# Patient Record
Sex: Female | Born: 1990 | Race: White | Hispanic: No | Marital: Single | Smoking: Never smoker
Health system: Southern US, Community
[De-identification: ages and names within clinical notes are randomized; demographics above are authoritative.]

## PROBLEM LIST (undated history)

## (undated) DIAGNOSIS — R630 Anorexia: Secondary | ICD-10-CM

## (undated) DIAGNOSIS — F429 Obsessive-compulsive disorder, unspecified: Secondary | ICD-10-CM

## (undated) HISTORY — DX: Anorexia: R63.0

## (undated) HISTORY — DX: Obsessive-compulsive disorder, unspecified: F42.9

---
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
## 2009-05-10 ENCOUNTER — Ambulatory Visit: Payer: Self-pay | Admitting: Women's Health

## 2010-03-26 ENCOUNTER — Ambulatory Visit: Payer: Self-pay | Admitting: Women's Health

## 2010-05-14 ENCOUNTER — Ambulatory Visit: Payer: Self-pay | Admitting: Women's Health

## 2011-05-16 ENCOUNTER — Encounter: Payer: Self-pay | Admitting: Women's Health

## 2011-07-01 ENCOUNTER — Encounter (INDEPENDENT_AMBULATORY_CARE_PROVIDER_SITE_OTHER): Payer: 59 | Admitting: Women's Health

## 2011-07-01 ENCOUNTER — Other Ambulatory Visit (HOSPITAL_COMMUNITY)
Admission: RE | Admit: 2011-07-01 | Discharge: 2011-07-01 | Disposition: A | Discharge status: Home / Self Care | Payer: 59 | Source: Ambulatory Visit | Attending: Obstetrics and Gynecology | Admitting: Obstetrics and Gynecology

## 2011-07-01 ENCOUNTER — Other Ambulatory Visit: Payer: Self-pay | Admitting: Women's Health

## 2011-07-01 DIAGNOSIS — Z124 Encounter for screening for malignant neoplasm of cervix: Secondary | ICD-10-CM | POA: Insufficient documentation

## 2011-07-01 DIAGNOSIS — Z01419 Encounter for gynecological examination (general) (routine) without abnormal findings: Secondary | ICD-10-CM

## 2011-07-31 ENCOUNTER — Telehealth: Payer: Self-pay
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
## 2011-08-01 ENCOUNTER — Other Ambulatory Visit: Payer: Self-pay | Admitting: Women's Health

## 2011-08-20 MED ORDER — NORETHIN ACE-ETH ESTRAD-FE 1-20 MG-MCG PO TABS: ORAL_TABLET | ORAL | Status: DC

## 2011-08-20 NOTE — Telephone Encounter (Signed)
Addended by: Richardson Chiquito on: 08/20/2011 12:45 PM   Modules accepted: Orders

## 2011-10-02 LAB — RAPID STREP SCREEN (MED CTR MEBANE ONLY)
Streptococcus, Group A Screen (Direct): NEGATIVE
Streptococcus, Group A Screen (Direct): POSITIVE — AB

## 2011-10-02 LAB — DIFFERENTIAL
Basophils Absolute: 0
Eosinophils Relative: 1
Lymphocytes Relative: 8 — ABNORMAL LOW
Lymphs Abs: 1.3
Monocytes Absolute: 0.7
Monocytes Relative: 4
Neutro Abs: 14.9 — ABNORMAL HIGH

## 2011-10-02 LAB — URINALYSIS, ROUTINE W REFLEX MICROSCOPIC
Hgb urine dipstick: NEGATIVE
Nitrite: NEGATIVE
Protein, ur: NEGATIVE
Specific Gravity, Urine: 1.023
Urobilinogen, UA: 1

## 2011-10-02 LAB — CBC
HCT: 39.3
MCV: 91.3
Platelets: 325
RDW: 12
WBC: 17 — ABNORMAL HIGH

## 2011-10-02 LAB — COMPREHENSIVE METABOLIC PANEL
AST: 74 — ABNORMAL HIGH
Albumin: 4.2
BUN: 8
Chloride: 106
Creatinine, Ser: 0.53
GFR calc Af Amer: >60
Total Protein: 8

## 2011-11-12 ENCOUNTER — Other Ambulatory Visit: Payer: Self-pay | Admitting: Family Medicine

## 2011-11-15 ENCOUNTER — Ambulatory Visit
Admission: RE | Admit: 2011-11-15 | Discharge: 2011-11-15 | Disposition: A | Discharge status: Home / Self Care | Payer: 59 | Source: Ambulatory Visit | Attending: Family Medicine | Admitting: Family Medicine

## 2011-12-31 HISTORY — PX: BREAST SURGERY: SHX581

## 2012-03-13 ENCOUNTER — Encounter: Payer: Self-pay | Admitting: Obstetrics and Gynecology

## 2012-03-13 ENCOUNTER — Ambulatory Visit (INDEPENDENT_AMBULATORY_CARE_PROVIDER_SITE_OTHER): Payer: Commercial Managed Care - PPO | Admitting: Obstetrics and Gynecology

## 2012-03-13 ENCOUNTER — Other Ambulatory Visit: Payer: Self-pay | Admitting: Obstetrics and Gynecology

## 2012-03-13 DIAGNOSIS — N390 Urinary tract infection, site not specified: Secondary | ICD-10-CM

## 2012-03-13 DIAGNOSIS — N898 Other specified noninflammatory disorders of vagina: Secondary | ICD-10-CM

## 2012-03-13 DIAGNOSIS — L293 Anogenital pruritus, unspecified: Secondary | ICD-10-CM

## 2012-03-13 DIAGNOSIS — Z113 Encounter for screening for infections with a predominantly sexual mode of transmission: Secondary | ICD-10-CM

## 2012-03-13 LAB — URINALYSIS W MICROSCOPIC + REFLEX CULTURE
Casts: NONE SEEN
Ketones, ur: NEGATIVE mg/dL
Nitrite: NEGATIVE
Protein, ur: NEGATIVE mg/dL
pH: 5.5 (ref 5.0–8.0)

## 2012-03-13 LAB — WET PREP FOR TRICH, YEAST, CLUE
Clue Cells Wet Prep HPF POC: NONE SEEN
Trich, Wet Prep: NONE SEEN

## 2012-03-13 LAB — HEPATITIS B SURFACE ANTIGEN: Hepatitis B Surface Ag: NEGATIVE

## 2012-03-13 MED ORDER — TERCONAZOLE 0.8 % VA CREA: 1.0000 | TOPICAL_CREAM | Freq: Every day | VAGINAL | Status: AC

## 2012-03-13 NOTE — Progress Notes (Signed)
The patient came in today because of vulvar and vaginal irritation and  discharge. She had been on amoxicillin for sinusitis. She then went on a cruise and had intercourse and thought  she was developing a UTI so is now on her fourth day of Keflex. She took one day of Diflucan but did not feel she got any relief from it. She had her last menstrual period at the end of February and had intercourse on March 11. She isn't sure whether a condom was used. She has taken Plan B. She also wanted to be checked for std.  Exam: Kennon Portela present.  Ext: significant vulvitis. Vaginal exam:yeast like discharge. Wet Prep negative. Cervix clean. GC and chlamydial cultures obtained. Uterus: Normal size and shape. Adnexa: Within normal limits.  Assessment: Yeast vaginitis. UTI now on Keflex.  Plan: Blood done for other STDs. Patient will let  me know if she's late for her period. Recheck HIV in June. Terconazole 3 cream. Urine culture obtained.

## 2012-03-14 LAB — GC/CHLAMYDIA PROBE AMP, GENITAL
Chlamydia, DNA Probe: NEGATIVE
GC Probe Amp, Genital: NEGATIVE

## 2012-03-14 LAB — RPR

## 2012-03-15 LAB — URINE CULTURE: Colony Count: NO GROWTH

## 2012-07-20 ENCOUNTER — Ambulatory Visit (INDEPENDENT_AMBULATORY_CARE_PROVIDER_SITE_OTHER): Payer: Commercial Managed Care - PPO | Admitting: Women's Health

## 2012-07-20 ENCOUNTER — Encounter: Payer: Self-pay | Admitting: Women's Health

## 2012-07-20 VITALS — BP 110/70 | Ht 62.75 in | Wt 139.5 lb

## 2012-07-20 DIAGNOSIS — Z8659 Personal history of other mental and behavioral disorders: Secondary | ICD-10-CM

## 2012-07-20 DIAGNOSIS — Z01419 Encounter for gynecological examination (general) (routine) without abnormal findings: Secondary | ICD-10-CM

## 2012-07-20 DIAGNOSIS — Z113 Encounter for screening for infections with a predominantly sexual mode of transmission: Secondary | ICD-10-CM

## 2012-07-20 DIAGNOSIS — Z833 Family history of diabetes mellitus: Secondary | ICD-10-CM

## 2012-07-20 LAB — CBC WITH DIFFERENTIAL/PLATELET
Basophils Absolute: 0 10*3/uL (ref 0.0–0.1)
Eosinophils Relative: 2 % (ref 0–5)
HCT: 38.9 % (ref 36.0–46.0)
Hemoglobin: 13.3 g/dL (ref 12.0–15.0)
Lymphocytes Relative: 34 % (ref 12–46)
Lymphs Abs: 2.1 10*3/uL (ref 0.7–4.0)
MCH: 30.4 pg (ref 26.0–34.0)
MCHC: 34.2 g/dL (ref 30.0–36.0)
Neutro Abs: 3.4 10*3/uL (ref 1.7–7.7)
Platelets: 300 10*3/uL (ref 150–400)

## 2012-07-20 NOTE — Progress Notes (Signed)
Jereline Ticer May 16, 1991 [id number]    History:    The patient presents for annual exam.  Monthly 3-4 days cycle/condoms. Normal Pap last year. Completed gardasil series. History of anorexia/OCD declines need for counseling or medication at this time. Had saline breast implants.  Past medical history, past surgical history, family history and social history were all reviewed and documented in the EPIC chart. Graduated from [employer] - 3.9 average, starting med school at [employer]. Father type I diabetic.   ROS:  A  ROS was performed and pertinent positives and negatives are included in the history.  Exam:  Filed Vitals:   07/20/12 1403  BP: 110/70    General appearance:  Normal Head/Neck:  Normal, without cervical or supraclavicular adenopathy. Thyroid:  Symmetrical, normal in size, without palpable masses or nodularity. Respiratory  Effort:  Normal  Auscultation:  Clear without wheezing or rhonchi Cardiovascular  Auscultation:  Regular rate, without rubs, murmurs or gallops  Edema/varicosities:  Not grossly evident Abdominal  Soft,nontender, without masses, guarding or rebound.  Liver/spleen:  No organomegaly noted  Hernia:  None appreciated  Skin  Inspection:  Grossly normal  Palpation:  Grossly normal Neurologic/psychiatric  Orientation:  Normal with appropriate conversation.  Mood/affect:  Normal  Genitourinary    Breasts: Examined lying and sitting/saline implants.     Right: Without masses, retractions, discharge or axillary adenopathy.     Left: Without masses, retractions, discharge or axillary adenopathy.   Inguinal/mons:  Normal without inguinal adenopathy  External genitalia:  Normal  BUS/Urethra/Skene's glands:  Normal  Bladder:  Normal  Vagina:  Normal  Cervix:  Normal  Uterus:   normal in size, shape and contour.  Midline and mobile  Adnexa/parametria:     Rt: Without masses or tenderness.   Lt: Without masses or tenderness.  Anus and  perineum: Normal     Assessment/Plan:  21 y.o. SWF G0 for annual exam with no complaints.  Normal GYN exam STD screen History of anorexia/OCD  Plan: Contraception management/options reviewed declines will continue with condoms. SBE's, exercise, calcium rich diet, campus safety reviewed. CBC, glucose, UA, GC/Chlamydia, HIV, hepatitis B/C., RPR.    Harrington Challenger WHNP, 2:35 PM 07/20/2012

## 2012-07-20 NOTE — Patient Instructions (Addendum)

## 2012-07-21 LAB — URINALYSIS W MICROSCOPIC + REFLEX CULTURE
Bilirubin Urine: NEGATIVE
Crystals: NONE SEEN
Leukocytes, UA: NEGATIVE
Nitrite: NEGATIVE
Protein, ur: NEGATIVE mg/dL
Specific Gravity, Urine: 1.016 (ref 1.005–1.030)
Squamous Epithelial / LPF: NONE SEEN
Urobilinogen, UA: 0.2 mg/dL (ref 0.0–1.0)

## 2012-07-21 LAB — HEPATITIS C ANTIBODY: HCV Ab: NEGATIVE

## 2012-07-21 LAB — GLUCOSE, RANDOM: Glucose, Bld: 79 mg/dL (ref 70–99)

## 2012-07-21 LAB — HEPATITIS B SURFACE ANTIGEN: Hepatitis B Surface Ag: NEGATIVE

## 2012-07-21 LAB — RPR

## 2012-07-21 LAB — GC/CHLAMYDIA PROBE AMP, GENITAL: GC Probe Amp, Genital: NEGATIVE

## 2012-08-14 ENCOUNTER — Telehealth: Payer: Self-pay | Admitting: *Deleted

## 2012-08-14 NOTE — Telephone Encounter (Signed)
Pt called requesting results of STD screening, left on voicemail test results negative.

## 2013-03-18 IMAGING — CT CT HEAD W/O CM
2 series · 16 of 30 positions shown, 20 images · non-contrast
Comparison: None.

CLINICAL DATA: Headaches

CT HEAD WITHOUT CONTRAST
TECHNIQUE: Contiguous axial images were obtained from the base of
the skull through the vertex without contrast

[Series 3: head bone · axial · 0.49mm/px · z∈[+37,+79]mm · 3 of 28 slices shown]
[im 2/28  bone]
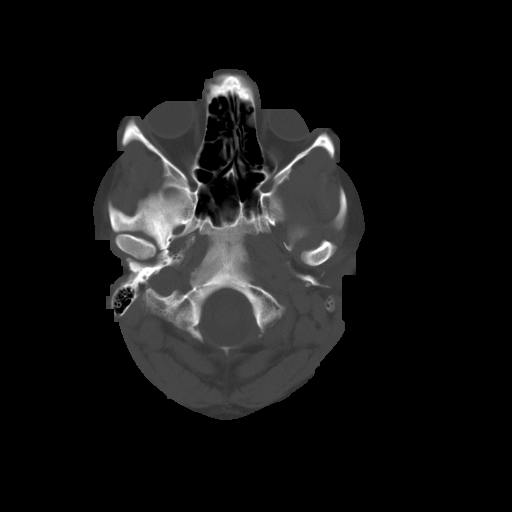
[im 6/28  bone]
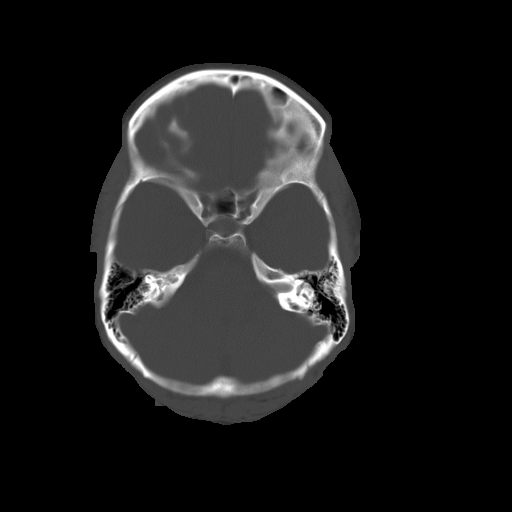
[im 10/28  bone]
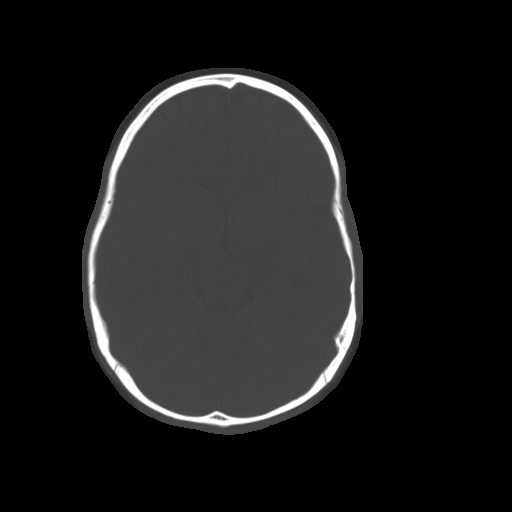

[Series 32: 3d filtered head w/o · axial · non-contrast · 0.49mm/px · z∈[+37,+163]mm · 13 of 28 slices shown, 17 images]
[im 2/28  brain]
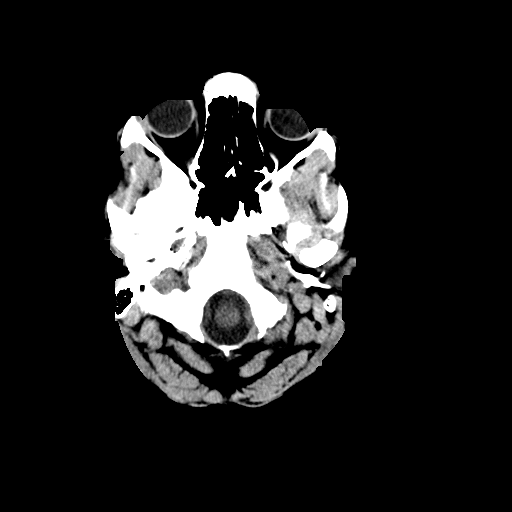
[im 2/28  bone]
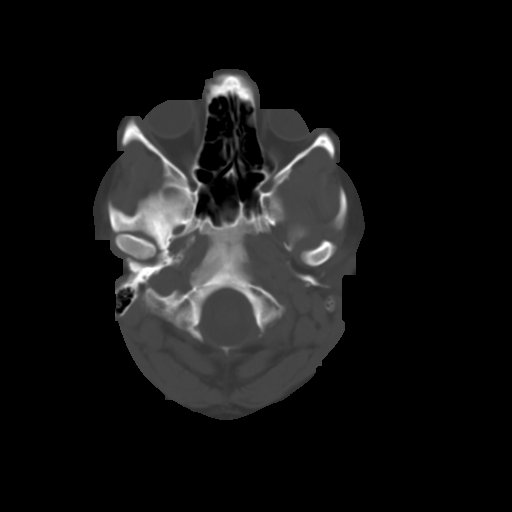
[im 4/28  brain]
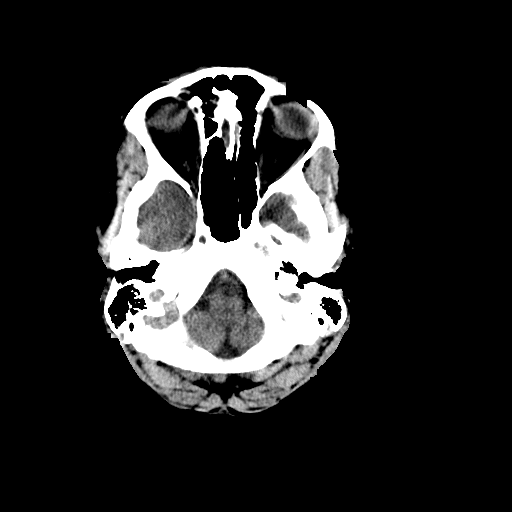
[im 6/28  brain]
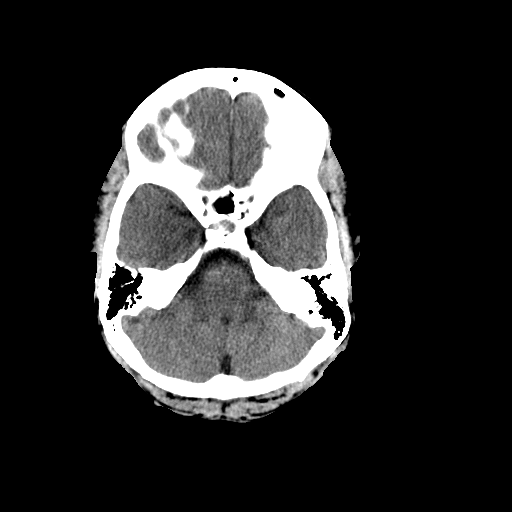
[im 8/28  brain]
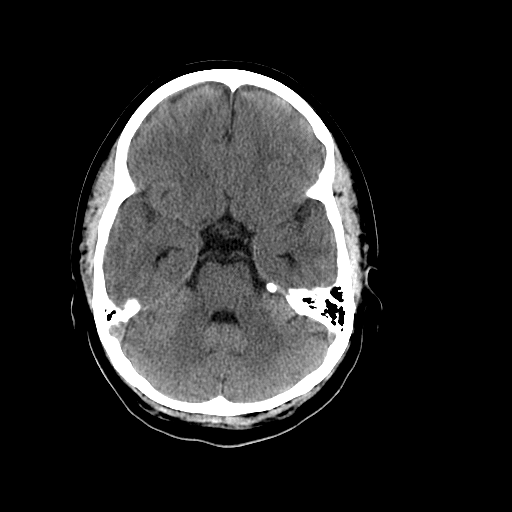
[im 10/28  brain]
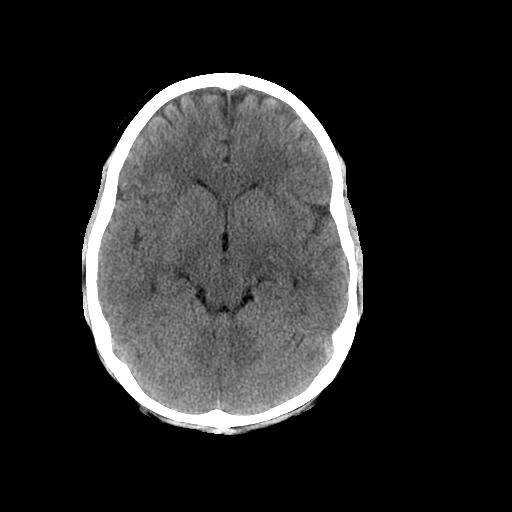
[im 10/28  bone]
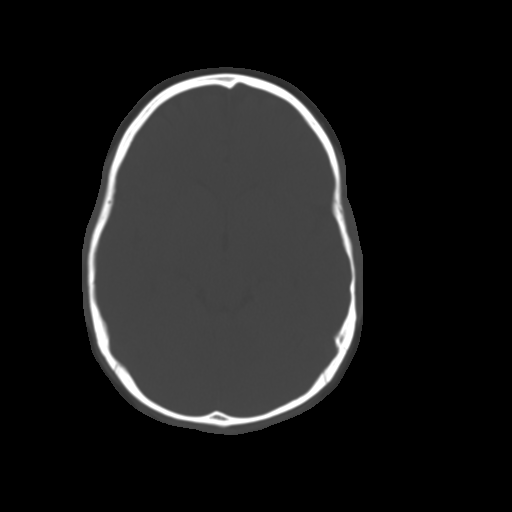
[im 12/28  brain]
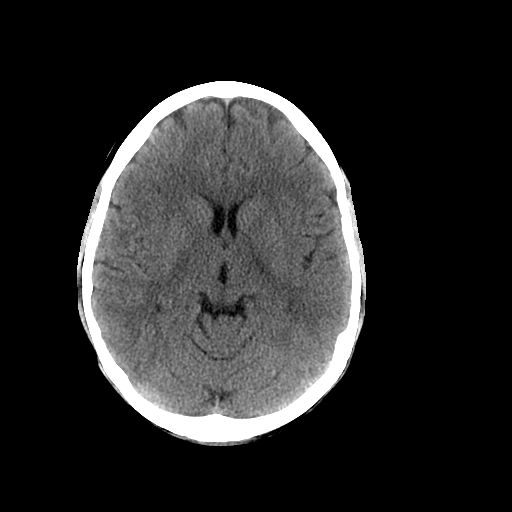
[im 14/28  brain]
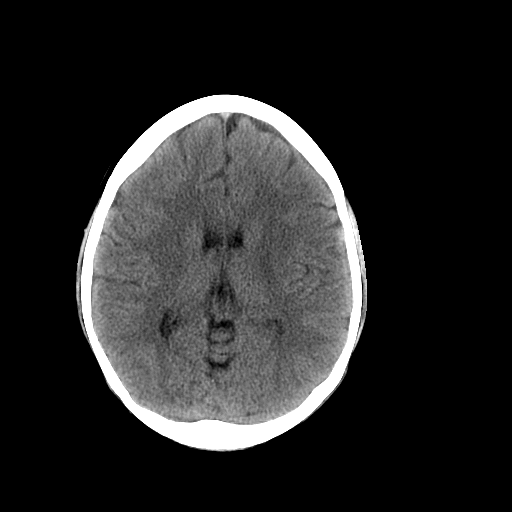
[im 16/28  brain]
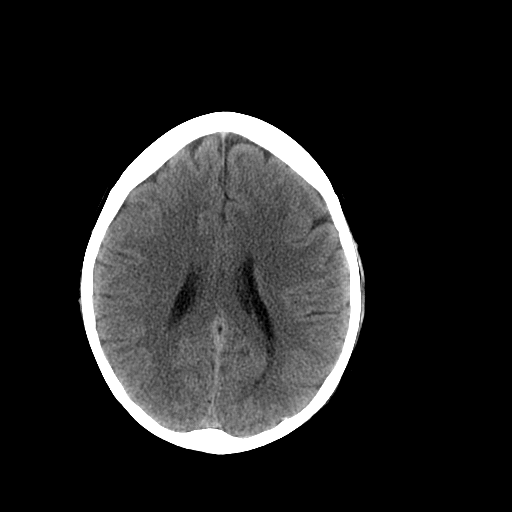
[im 18/28  brain]
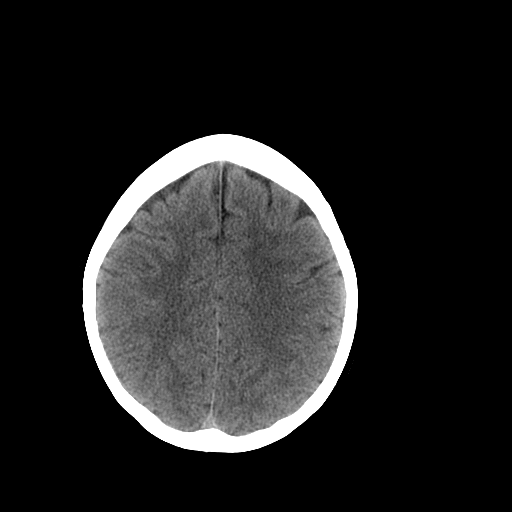
[im 18/28  bone]
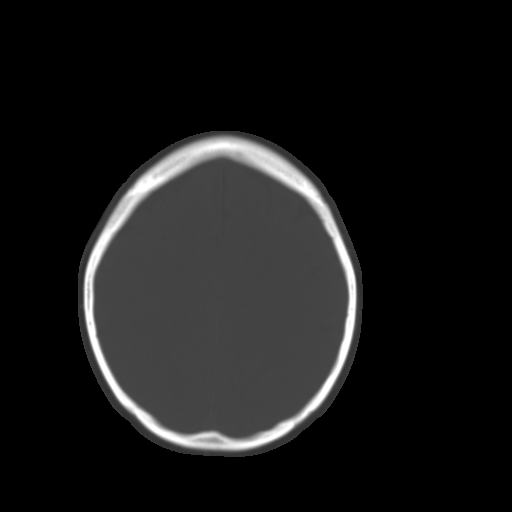
[im 20/28  brain]
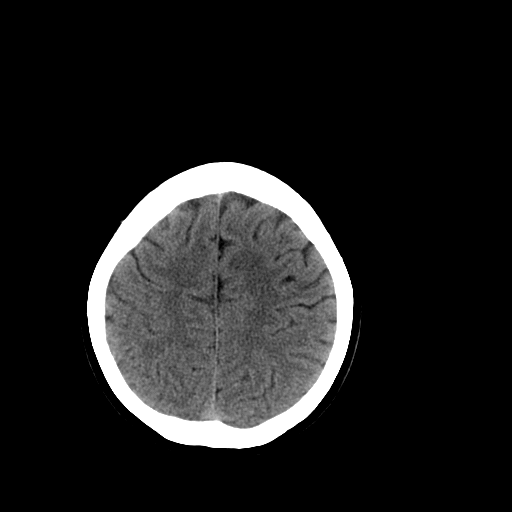
[im 22/28  brain]
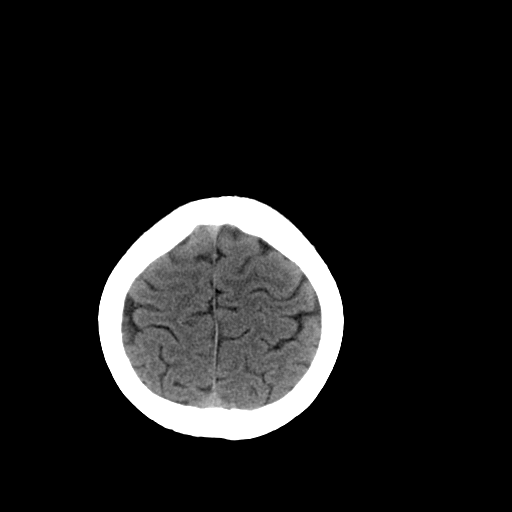
[im 24/28  brain]
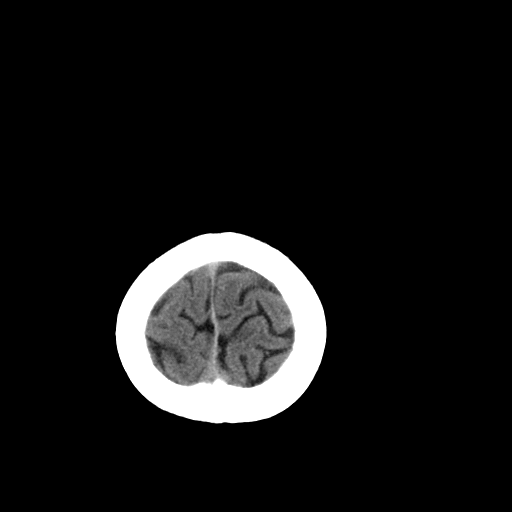
[im 26/28  brain]
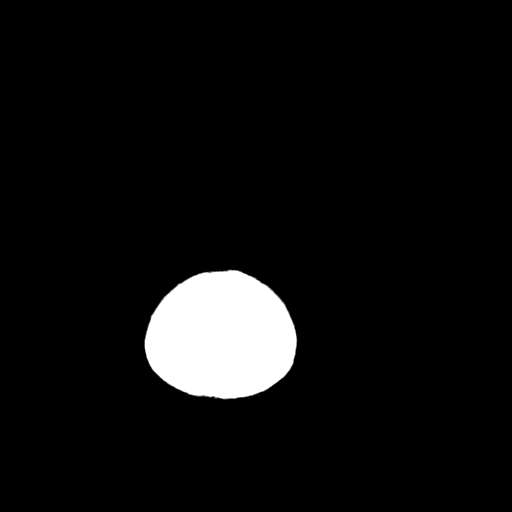
[im 26/28  bone]
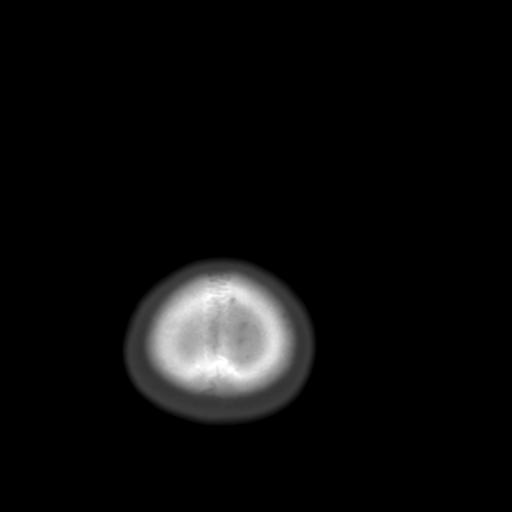

[16 of 30 positions shown; findings below may reference images not displayed]

FINDINGS: The brain has a normal appearance without evidence for
hemorrhage, acute infarction, hydrocephalus, or mass lesion.  There
is no extra axial fluid collection.  The skull and paranasal
sinuses are normal.
IMPRESSION: Normal CT of the head without contrast.

## 2013-07-21 ENCOUNTER — Ambulatory Visit (INDEPENDENT_AMBULATORY_CARE_PROVIDER_SITE_OTHER): Payer: Commercial Managed Care - PPO | Admitting: Women's Health

## 2013-07-21 ENCOUNTER — Other Ambulatory Visit (HOSPITAL_COMMUNITY)
Admission: RE | Admit: 2013-07-21 | Discharge: 2013-07-21 | Disposition: A | Discharge status: Home / Self Care | Payer: Commercial Managed Care - PPO | Source: Ambulatory Visit | Attending: Gynecology | Admitting: Gynecology

## 2013-07-21 ENCOUNTER — Encounter: Payer: Self-pay | Admitting: Women's Health

## 2013-07-21 VITALS — BP 98/62 | Ht 64.0 in | Wt 134.0 lb

## 2013-07-21 DIAGNOSIS — Z113 Encounter for screening for infections with a predominantly sexual mode of transmission: Secondary | ICD-10-CM

## 2013-07-21 DIAGNOSIS — Z01419 Encounter for gynecological examination (general) (routine) without abnormal findings: Secondary | ICD-10-CM | POA: Insufficient documentation

## 2013-07-21 LAB — CBC WITH DIFFERENTIAL/PLATELET
Basophils Absolute: 0 10*3/uL (ref 0.0–0.1)
Basophils Relative: 0 % (ref 0–1)
Eosinophils Relative: 1 % (ref 0–5)
HCT: 37.6 % (ref 36.0–46.0)
Lymphocytes Relative: 30 % (ref 12–46)
MCHC: 35.4 g/dL (ref 30.0–36.0)
Monocytes Absolute: 0.6 10*3/uL (ref 0.1–1.0)
Neutro Abs: 4.7 10*3/uL (ref 1.7–7.7)
Platelets: 294 10*3/uL (ref 150–400)
RDW: 12.8 % (ref 11.5–15.5)
WBC: 7.9 10*3/uL (ref 4.0–10.5)

## 2013-07-21 NOTE — Addendum Note (Signed)
Addended by: Richardson Chiquito on: 07/21/2013 05:11 PM   Modules accepted: Orders

## 2013-07-21 NOTE — Patient Instructions (Addendum)
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
## 2013-07-22 LAB — URINALYSIS W MICROSCOPIC + REFLEX CULTURE
Bacteria, UA: NONE SEEN
Bilirubin Urine: NEGATIVE
Crystals: NONE SEEN
Ketones, ur: NEGATIVE mg/dL
Nitrite: NEGATIVE
Protein, ur: NEGATIVE mg/dL
Specific Gravity, Urine: 1.016 (ref 1.005–1.030)
Urobilinogen, UA: 0.2 mg/dL (ref 0.0–1.0)

## 2013-07-22 LAB — HEPATITIS B SURFACE ANTIGEN: Hepatitis B Surface Ag: NEGATIVE

## 2013-07-22 LAB — HEPATITIS C ANTIBODY: HCV Ab: NEGATIVE

## 2013-07-22 LAB — GC/CHLAMYDIA PROBE AMP: GC Probe RNA: NEGATIVE

## 2013-07-28 ENCOUNTER — Telehealth: Payer: Self-pay | Admitting: *Deleted

## 2013-07-28 NOTE — Telephone Encounter (Signed)
Pt informed with lab results from [facility] 07/21/13.

## 2014-09-21 ENCOUNTER — Encounter: Payer: Self-pay | Admitting: Women's Health

## 2014-09-21 ENCOUNTER — Ambulatory Visit (INDEPENDENT_AMBULATORY_CARE_PROVIDER_SITE_OTHER): Payer: Commercial Managed Care - PPO | Admitting: Women's Health

## 2014-09-21 VITALS — BP 122/78 | Ht 64.0 in

## 2014-09-21 DIAGNOSIS — Z23 Encounter for immunization: Secondary | ICD-10-CM

## 2014-09-21 DIAGNOSIS — Z01419 Encounter for gynecological examination (general) (routine) without abnormal findings: Secondary | ICD-10-CM

## 2014-09-21 DIAGNOSIS — Z833 Family history of diabetes mellitus: Secondary | ICD-10-CM

## 2014-09-21 DIAGNOSIS — N926 Irregular menstruation, unspecified: Secondary | ICD-10-CM

## 2014-09-21 DIAGNOSIS — E079 Disorder of thyroid, unspecified: Secondary | ICD-10-CM
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
## 2014-09-21 NOTE — Patient Instructions (Signed)

## 2014-09-22 LAB — URINALYSIS W MICROSCOPIC + REFLEX CULTURE
Bacteria, UA: NONE SEEN
Bilirubin Urine: NEGATIVE
CRYSTALS: NONE SEEN
Casts: NONE SEEN
Glucose, UA: NEGATIVE mg/dL
Hgb urine dipstick: NEGATIVE
Ketones, ur: NEGATIVE mg/dL
LEUKOCYTES UA: NEGATIVE
NITRITE: NEGATIVE
Protein, ur: NEGATIVE mg/dL
SPECIFIC GRAVITY, URINE: 1.025 (ref 1.005–1.030)
SQUAMOUS EPITHELIAL / LPF: NONE SEEN
UROBILINOGEN UA: 0.2 mg/dL (ref 0.0–1.0)
pH: 5.5 (ref 5.0–8.0)

## 2014-11-01 ENCOUNTER — Encounter: Payer: Self-pay | Admitting: Women's Health
